# Patient Record
Sex: Female | Born: 1935 | Race: White | Hispanic: No | Smoking: Never smoker
Health system: Southern US, Community
[De-identification: ages and names within clinical notes are randomized; demographics above are authoritative.]

## PROBLEM LIST (undated history)

## (undated) DIAGNOSIS — E7800 Pure hypercholesterolemia, unspecified: Secondary | ICD-10-CM

## (undated) DIAGNOSIS — H5440 Blindness, one eye, unspecified eye: Secondary | ICD-10-CM

## (undated) DIAGNOSIS — M8580 Other specified disorders of bone density and structure, unspecified site: Secondary | ICD-10-CM

## (undated) DIAGNOSIS — E079 Disorder of thyroid, unspecified: Secondary | ICD-10-CM

## (undated) DIAGNOSIS — M1990 Unspecified osteoarthritis, unspecified site: Secondary | ICD-10-CM

## (undated) DIAGNOSIS — I10 Essential (primary) hypertension: Secondary | ICD-10-CM

## (undated) DIAGNOSIS — L900 Lichen sclerosus et atrophicus: Secondary | ICD-10-CM

## (undated) HISTORY — DX: Unspecified osteoarthritis, unspecified site: M19.90

## (undated) HISTORY — DX: Essential (primary) hypertension: I10

## (undated) HISTORY — PX: EYE SURGERY: SHX253

## (undated) HISTORY — DX: Disorder of thyroid, unspecified: E07.9

## (undated) HISTORY — DX: Other specified disorders of bone density and structure, unspecified site: M85.80

## (undated) HISTORY — DX: Pure hypercholesterolemia, unspecified: E78.00

## (undated) HISTORY — DX: Blindness, one eye, unspecified eye: H54.40

## (undated) HISTORY — DX: Lichen sclerosus et atrophicus: L90.0

---

## 2003-06-04 DIAGNOSIS — S0590XA Unspecified injury of unspecified eye and orbit, initial encounter: Secondary | ICD-10-CM

## 2003-06-04 HISTORY — DX: Unspecified injury of unspecified eye and orbit, initial encounter: S05.90XA

## 2015-08-07 DIAGNOSIS — N904 Leukoplakia of vulva: Secondary | ICD-10-CM

## 2015-08-07 HISTORY — DX: Leukoplakia of vulva: N90.4

## 2016-01-28 DIAGNOSIS — M1711 Unilateral primary osteoarthritis, right knee: Secondary | ICD-10-CM

## 2016-01-28 HISTORY — DX: Unilateral primary osteoarthritis, right knee: M17.11

## 2016-03-11 DIAGNOSIS — Z471 Aftercare following joint replacement surgery: Secondary | ICD-10-CM | POA: Insufficient documentation

## 2016-03-11 DIAGNOSIS — Z96651 Presence of right artificial knee joint: Secondary | ICD-10-CM

## 2016-03-11 HISTORY — DX: Aftercare following joint replacement surgery: Z47.1

## 2016-03-11 HISTORY — DX: Presence of right artificial knee joint: Z96.651

## 2016-07-04 DIAGNOSIS — M1611 Unilateral primary osteoarthritis, right hip: Secondary | ICD-10-CM

## 2016-07-04 HISTORY — DX: Unilateral primary osteoarthritis, right hip: M16.11

## 2016-08-15 DIAGNOSIS — L300 Nummular dermatitis: Secondary | ICD-10-CM | POA: Diagnosis not present

## 2016-08-15 DIAGNOSIS — L821 Other seborrheic keratosis: Secondary | ICD-10-CM | POA: Diagnosis not present

## 2016-08-15 DIAGNOSIS — L299 Pruritus, unspecified: Secondary | ICD-10-CM | POA: Diagnosis not present

## 2016-10-29 DIAGNOSIS — R600 Localized edema: Secondary | ICD-10-CM | POA: Diagnosis not present

## 2016-10-29 DIAGNOSIS — R21 Rash and other nonspecific skin eruption: Secondary | ICD-10-CM | POA: Diagnosis not present

## 2016-10-29 DIAGNOSIS — R32 Unspecified urinary incontinence: Secondary | ICD-10-CM | POA: Diagnosis not present

## 2016-10-29 DIAGNOSIS — Z6831 Body mass index (BMI) 31.0-31.9, adult: Secondary | ICD-10-CM | POA: Diagnosis not present

## 2016-10-31 DIAGNOSIS — R600 Localized edema: Secondary | ICD-10-CM | POA: Diagnosis not present

## 2016-10-31 DIAGNOSIS — M7989 Other specified soft tissue disorders: Secondary | ICD-10-CM | POA: Diagnosis not present

## 2016-10-31 DIAGNOSIS — M79661 Pain in right lower leg: Secondary | ICD-10-CM | POA: Diagnosis not present

## 2016-10-31 DIAGNOSIS — M79662 Pain in left lower leg: Secondary | ICD-10-CM | POA: Diagnosis not present

## 2016-11-01 DIAGNOSIS — L300 Nummular dermatitis: Secondary | ICD-10-CM | POA: Diagnosis not present

## 2016-11-01 DIAGNOSIS — L299 Pruritus, unspecified: Secondary | ICD-10-CM | POA: Diagnosis not present

## 2016-12-31 DIAGNOSIS — I10 Essential (primary) hypertension: Secondary | ICD-10-CM | POA: Diagnosis not present

## 2016-12-31 DIAGNOSIS — Z6831 Body mass index (BMI) 31.0-31.9, adult: Secondary | ICD-10-CM | POA: Diagnosis not present

## 2016-12-31 DIAGNOSIS — L309 Dermatitis, unspecified: Secondary | ICD-10-CM | POA: Diagnosis not present

## 2017-01-22 DIAGNOSIS — R21 Rash and other nonspecific skin eruption: Secondary | ICD-10-CM | POA: Diagnosis not present

## 2017-01-22 DIAGNOSIS — I10 Essential (primary) hypertension: Secondary | ICD-10-CM | POA: Diagnosis not present

## 2017-01-22 DIAGNOSIS — R946 Abnormal results of thyroid function studies: Secondary | ICD-10-CM | POA: Diagnosis not present

## 2017-02-04 DIAGNOSIS — Z85828 Personal history of other malignant neoplasm of skin: Secondary | ICD-10-CM | POA: Diagnosis not present

## 2017-02-04 DIAGNOSIS — D0371 Melanoma in situ of right lower limb, including hip: Secondary | ICD-10-CM | POA: Diagnosis not present

## 2017-02-04 DIAGNOSIS — L308 Other specified dermatitis: Secondary | ICD-10-CM | POA: Diagnosis not present

## 2017-02-04 DIAGNOSIS — D229 Melanocytic nevi, unspecified: Secondary | ICD-10-CM | POA: Diagnosis not present

## 2017-02-04 DIAGNOSIS — D485 Neoplasm of uncertain behavior of skin: Secondary | ICD-10-CM | POA: Diagnosis not present

## 2017-02-04 DIAGNOSIS — Z08 Encounter for follow-up examination after completed treatment for malignant neoplasm: Secondary | ICD-10-CM | POA: Diagnosis not present

## 2017-02-04 DIAGNOSIS — L309 Dermatitis, unspecified: Secondary | ICD-10-CM | POA: Diagnosis not present

## 2017-02-20 DIAGNOSIS — L905 Scar conditions and fibrosis of skin: Secondary | ICD-10-CM | POA: Diagnosis not present

## 2017-02-20 DIAGNOSIS — D0371 Melanoma in situ of right lower limb, including hip: Secondary | ICD-10-CM | POA: Diagnosis not present

## 2017-03-21 DIAGNOSIS — Z23 Encounter for immunization: Secondary | ICD-10-CM | POA: Diagnosis not present

## 2017-04-10 DIAGNOSIS — M9902 Segmental and somatic dysfunction of thoracic region: Secondary | ICD-10-CM | POA: Diagnosis not present

## 2017-04-10 DIAGNOSIS — M5137 Other intervertebral disc degeneration, lumbosacral region: Secondary | ICD-10-CM | POA: Diagnosis not present

## 2017-04-10 DIAGNOSIS — M9904 Segmental and somatic dysfunction of sacral region: Secondary | ICD-10-CM | POA: Diagnosis not present

## 2017-04-10 DIAGNOSIS — M791 Myalgia, unspecified site: Secondary | ICD-10-CM | POA: Diagnosis not present

## 2017-04-10 DIAGNOSIS — M9903 Segmental and somatic dysfunction of lumbar region: Secondary | ICD-10-CM | POA: Diagnosis not present

## 2017-04-22 DIAGNOSIS — S0990XA Unspecified injury of head, initial encounter: Secondary | ICD-10-CM | POA: Diagnosis not present

## 2017-04-22 DIAGNOSIS — Z1339 Encounter for screening examination for other mental health and behavioral disorders: Secondary | ICD-10-CM | POA: Diagnosis not present

## 2017-04-22 DIAGNOSIS — S51819A Laceration without foreign body of unspecified forearm, initial encounter: Secondary | ICD-10-CM | POA: Diagnosis not present

## 2017-04-29 DIAGNOSIS — Z6831 Body mass index (BMI) 31.0-31.9, adult: Secondary | ICD-10-CM | POA: Diagnosis not present

## 2017-04-29 DIAGNOSIS — R32 Unspecified urinary incontinence: Secondary | ICD-10-CM | POA: Diagnosis not present

## 2017-04-29 DIAGNOSIS — S51819A Laceration without foreign body of unspecified forearm, initial encounter: Secondary | ICD-10-CM | POA: Diagnosis not present

## 2017-05-01 DIAGNOSIS — M9904 Segmental and somatic dysfunction of sacral region: Secondary | ICD-10-CM | POA: Diagnosis not present

## 2017-05-01 DIAGNOSIS — M9902 Segmental and somatic dysfunction of thoracic region: Secondary | ICD-10-CM | POA: Diagnosis not present

## 2017-05-01 DIAGNOSIS — M791 Myalgia, unspecified site: Secondary | ICD-10-CM | POA: Diagnosis not present

## 2017-05-01 DIAGNOSIS — M9903 Segmental and somatic dysfunction of lumbar region: Secondary | ICD-10-CM | POA: Diagnosis not present

## 2017-05-01 DIAGNOSIS — M5137 Other intervertebral disc degeneration, lumbosacral region: Secondary | ICD-10-CM | POA: Diagnosis not present

## 2017-05-08 DIAGNOSIS — M5137 Other intervertebral disc degeneration, lumbosacral region: Secondary | ICD-10-CM | POA: Diagnosis not present

## 2017-05-08 DIAGNOSIS — M9902 Segmental and somatic dysfunction of thoracic region: Secondary | ICD-10-CM | POA: Diagnosis not present

## 2017-05-08 DIAGNOSIS — M9903 Segmental and somatic dysfunction of lumbar region: Secondary | ICD-10-CM | POA: Diagnosis not present

## 2017-05-08 DIAGNOSIS — M791 Myalgia, unspecified site: Secondary | ICD-10-CM | POA: Diagnosis not present

## 2017-05-08 DIAGNOSIS — M9904 Segmental and somatic dysfunction of sacral region: Secondary | ICD-10-CM | POA: Diagnosis not present

## 2017-05-20 DIAGNOSIS — N3001 Acute cystitis with hematuria: Secondary | ICD-10-CM | POA: Diagnosis not present

## 2017-05-20 DIAGNOSIS — I10 Essential (primary) hypertension: Secondary | ICD-10-CM | POA: Diagnosis not present

## 2017-05-20 DIAGNOSIS — Z6831 Body mass index (BMI) 31.0-31.9, adult: Secondary | ICD-10-CM | POA: Diagnosis not present

## 2017-05-20 DIAGNOSIS — S0990XA Unspecified injury of head, initial encounter: Secondary | ICD-10-CM | POA: Diagnosis not present

## 2017-06-05 DIAGNOSIS — M791 Myalgia, unspecified site: Secondary | ICD-10-CM | POA: Diagnosis not present

## 2017-06-05 DIAGNOSIS — M9904 Segmental and somatic dysfunction of sacral region: Secondary | ICD-10-CM | POA: Diagnosis not present

## 2017-06-05 DIAGNOSIS — M5137 Other intervertebral disc degeneration, lumbosacral region: Secondary | ICD-10-CM | POA: Diagnosis not present

## 2017-06-05 DIAGNOSIS — M9902 Segmental and somatic dysfunction of thoracic region: Secondary | ICD-10-CM | POA: Diagnosis not present

## 2017-06-05 DIAGNOSIS — M9903 Segmental and somatic dysfunction of lumbar region: Secondary | ICD-10-CM | POA: Diagnosis not present

## 2017-06-06 DIAGNOSIS — R05 Cough: Secondary | ICD-10-CM | POA: Diagnosis not present

## 2017-06-06 DIAGNOSIS — Z1331 Encounter for screening for depression: Secondary | ICD-10-CM | POA: Diagnosis not present

## 2017-06-06 DIAGNOSIS — Z6831 Body mass index (BMI) 31.0-31.9, adult: Secondary | ICD-10-CM | POA: Diagnosis not present

## 2017-06-06 DIAGNOSIS — I10 Essential (primary) hypertension: Secondary | ICD-10-CM | POA: Diagnosis not present

## 2017-06-06 DIAGNOSIS — Z9181 History of falling: Secondary | ICD-10-CM | POA: Diagnosis not present

## 2017-06-06 DIAGNOSIS — Z0000 Encounter for general adult medical examination without abnormal findings: Secondary | ICD-10-CM | POA: Diagnosis not present

## 2017-06-06 DIAGNOSIS — R600 Localized edema: Secondary | ICD-10-CM | POA: Diagnosis not present

## 2017-06-17 DIAGNOSIS — R946 Abnormal results of thyroid function studies: Secondary | ICD-10-CM | POA: Diagnosis not present

## 2017-06-26 DIAGNOSIS — M5137 Other intervertebral disc degeneration, lumbosacral region: Secondary | ICD-10-CM | POA: Diagnosis not present

## 2017-06-26 DIAGNOSIS — M791 Myalgia, unspecified site: Secondary | ICD-10-CM | POA: Diagnosis not present

## 2017-06-26 DIAGNOSIS — M9902 Segmental and somatic dysfunction of thoracic region: Secondary | ICD-10-CM | POA: Diagnosis not present

## 2017-06-26 DIAGNOSIS — M9904 Segmental and somatic dysfunction of sacral region: Secondary | ICD-10-CM | POA: Diagnosis not present

## 2017-06-26 DIAGNOSIS — M9903 Segmental and somatic dysfunction of lumbar region: Secondary | ICD-10-CM | POA: Diagnosis not present

## 2017-06-30 DIAGNOSIS — R490 Dysphonia: Secondary | ICD-10-CM | POA: Diagnosis not present

## 2017-06-30 DIAGNOSIS — R946 Abnormal results of thyroid function studies: Secondary | ICD-10-CM | POA: Diagnosis not present

## 2017-07-03 DIAGNOSIS — M791 Myalgia, unspecified site: Secondary | ICD-10-CM | POA: Diagnosis not present

## 2017-07-03 DIAGNOSIS — M9903 Segmental and somatic dysfunction of lumbar region: Secondary | ICD-10-CM | POA: Diagnosis not present

## 2017-07-03 DIAGNOSIS — M5137 Other intervertebral disc degeneration, lumbosacral region: Secondary | ICD-10-CM | POA: Diagnosis not present

## 2017-07-03 DIAGNOSIS — M9904 Segmental and somatic dysfunction of sacral region: Secondary | ICD-10-CM | POA: Diagnosis not present

## 2017-07-03 DIAGNOSIS — M9902 Segmental and somatic dysfunction of thoracic region: Secondary | ICD-10-CM | POA: Diagnosis not present

## 2017-07-10 DIAGNOSIS — M5137 Other intervertebral disc degeneration, lumbosacral region: Secondary | ICD-10-CM | POA: Diagnosis not present

## 2017-07-10 DIAGNOSIS — M791 Myalgia, unspecified site: Secondary | ICD-10-CM | POA: Diagnosis not present

## 2017-07-10 DIAGNOSIS — M9902 Segmental and somatic dysfunction of thoracic region: Secondary | ICD-10-CM | POA: Diagnosis not present

## 2017-07-10 DIAGNOSIS — M9904 Segmental and somatic dysfunction of sacral region: Secondary | ICD-10-CM | POA: Diagnosis not present

## 2017-07-10 DIAGNOSIS — M9903 Segmental and somatic dysfunction of lumbar region: Secondary | ICD-10-CM | POA: Diagnosis not present

## 2017-07-24 DIAGNOSIS — M9902 Segmental and somatic dysfunction of thoracic region: Secondary | ICD-10-CM | POA: Diagnosis not present

## 2017-07-24 DIAGNOSIS — M9903 Segmental and somatic dysfunction of lumbar region: Secondary | ICD-10-CM | POA: Diagnosis not present

## 2017-07-24 DIAGNOSIS — M791 Myalgia, unspecified site: Secondary | ICD-10-CM | POA: Diagnosis not present

## 2017-07-24 DIAGNOSIS — M5137 Other intervertebral disc degeneration, lumbosacral region: Secondary | ICD-10-CM | POA: Diagnosis not present

## 2017-07-24 DIAGNOSIS — M9904 Segmental and somatic dysfunction of sacral region: Secondary | ICD-10-CM | POA: Diagnosis not present

## 2017-08-05 DIAGNOSIS — H04123 Dry eye syndrome of bilateral lacrimal glands: Secondary | ICD-10-CM | POA: Diagnosis not present

## 2017-08-05 DIAGNOSIS — H524 Presbyopia: Secondary | ICD-10-CM | POA: Diagnosis not present

## 2017-08-07 DIAGNOSIS — M791 Myalgia, unspecified site: Secondary | ICD-10-CM | POA: Diagnosis not present

## 2017-08-07 DIAGNOSIS — M9904 Segmental and somatic dysfunction of sacral region: Secondary | ICD-10-CM | POA: Diagnosis not present

## 2017-08-07 DIAGNOSIS — M9903 Segmental and somatic dysfunction of lumbar region: Secondary | ICD-10-CM | POA: Diagnosis not present

## 2017-08-07 DIAGNOSIS — M9902 Segmental and somatic dysfunction of thoracic region: Secondary | ICD-10-CM | POA: Diagnosis not present

## 2017-08-07 DIAGNOSIS — M5137 Other intervertebral disc degeneration, lumbosacral region: Secondary | ICD-10-CM | POA: Diagnosis not present

## 2017-08-13 DIAGNOSIS — D229 Melanocytic nevi, unspecified: Secondary | ICD-10-CM | POA: Diagnosis not present

## 2017-08-13 DIAGNOSIS — Z85820 Personal history of malignant melanoma of skin: Secondary | ICD-10-CM | POA: Diagnosis not present

## 2017-08-13 DIAGNOSIS — B078 Other viral warts: Secondary | ICD-10-CM | POA: Diagnosis not present

## 2017-08-13 DIAGNOSIS — L821 Other seborrheic keratosis: Secondary | ICD-10-CM | POA: Diagnosis not present

## 2017-08-13 DIAGNOSIS — L309 Dermatitis, unspecified: Secondary | ICD-10-CM | POA: Diagnosis not present

## 2017-08-13 DIAGNOSIS — Z08 Encounter for follow-up examination after completed treatment for malignant neoplasm: Secondary | ICD-10-CM | POA: Diagnosis not present

## 2017-08-13 DIAGNOSIS — Z85828 Personal history of other malignant neoplasm of skin: Secondary | ICD-10-CM | POA: Diagnosis not present

## 2017-08-28 DIAGNOSIS — M791 Myalgia, unspecified site: Secondary | ICD-10-CM | POA: Diagnosis not present

## 2017-08-28 DIAGNOSIS — M9902 Segmental and somatic dysfunction of thoracic region: Secondary | ICD-10-CM | POA: Diagnosis not present

## 2017-08-28 DIAGNOSIS — M5137 Other intervertebral disc degeneration, lumbosacral region: Secondary | ICD-10-CM | POA: Diagnosis not present

## 2017-08-28 DIAGNOSIS — M9904 Segmental and somatic dysfunction of sacral region: Secondary | ICD-10-CM | POA: Diagnosis not present

## 2017-08-28 DIAGNOSIS — M9903 Segmental and somatic dysfunction of lumbar region: Secondary | ICD-10-CM | POA: Diagnosis not present

## 2017-09-05 DIAGNOSIS — N9089 Other specified noninflammatory disorders of vulva and perineum: Secondary | ICD-10-CM | POA: Diagnosis not present

## 2017-09-05 DIAGNOSIS — L259 Unspecified contact dermatitis, unspecified cause: Secondary | ICD-10-CM | POA: Diagnosis not present

## 2017-09-11 DIAGNOSIS — M791 Myalgia, unspecified site: Secondary | ICD-10-CM | POA: Diagnosis not present

## 2017-09-11 DIAGNOSIS — M5137 Other intervertebral disc degeneration, lumbosacral region: Secondary | ICD-10-CM | POA: Diagnosis not present

## 2017-09-11 DIAGNOSIS — M9902 Segmental and somatic dysfunction of thoracic region: Secondary | ICD-10-CM | POA: Diagnosis not present

## 2017-09-11 DIAGNOSIS — M9904 Segmental and somatic dysfunction of sacral region: Secondary | ICD-10-CM | POA: Diagnosis not present

## 2017-09-11 DIAGNOSIS — M9903 Segmental and somatic dysfunction of lumbar region: Secondary | ICD-10-CM | POA: Diagnosis not present

## 2017-09-17 DIAGNOSIS — N763 Subacute and chronic vulvitis: Secondary | ICD-10-CM | POA: Diagnosis not present

## 2017-09-30 DIAGNOSIS — E0590 Thyrotoxicosis, unspecified without thyrotoxic crisis or storm: Secondary | ICD-10-CM | POA: Diagnosis not present

## 2017-10-09 DIAGNOSIS — M5137 Other intervertebral disc degeneration, lumbosacral region: Secondary | ICD-10-CM | POA: Diagnosis not present

## 2017-10-09 DIAGNOSIS — M9903 Segmental and somatic dysfunction of lumbar region: Secondary | ICD-10-CM | POA: Diagnosis not present

## 2017-10-09 DIAGNOSIS — M9904 Segmental and somatic dysfunction of sacral region: Secondary | ICD-10-CM | POA: Diagnosis not present

## 2017-10-09 DIAGNOSIS — M9902 Segmental and somatic dysfunction of thoracic region: Secondary | ICD-10-CM | POA: Diagnosis not present

## 2017-10-09 DIAGNOSIS — M791 Myalgia, unspecified site: Secondary | ICD-10-CM | POA: Diagnosis not present

## 2017-10-28 DIAGNOSIS — M9903 Segmental and somatic dysfunction of lumbar region: Secondary | ICD-10-CM | POA: Diagnosis not present

## 2017-10-28 DIAGNOSIS — M791 Myalgia, unspecified site: Secondary | ICD-10-CM | POA: Diagnosis not present

## 2017-10-28 DIAGNOSIS — M9904 Segmental and somatic dysfunction of sacral region: Secondary | ICD-10-CM | POA: Diagnosis not present

## 2017-10-28 DIAGNOSIS — M9902 Segmental and somatic dysfunction of thoracic region: Secondary | ICD-10-CM | POA: Diagnosis not present

## 2017-10-28 DIAGNOSIS — M5137 Other intervertebral disc degeneration, lumbosacral region: Secondary | ICD-10-CM | POA: Diagnosis not present

## 2017-11-06 DIAGNOSIS — E042 Nontoxic multinodular goiter: Secondary | ICD-10-CM | POA: Diagnosis not present

## 2017-11-06 DIAGNOSIS — R946 Abnormal results of thyroid function studies: Secondary | ICD-10-CM | POA: Diagnosis not present

## 2017-11-07 DIAGNOSIS — E042 Nontoxic multinodular goiter: Secondary | ICD-10-CM | POA: Diagnosis not present

## 2017-11-07 DIAGNOSIS — E0590 Thyrotoxicosis, unspecified without thyrotoxic crisis or storm: Secondary | ICD-10-CM | POA: Diagnosis not present

## 2017-11-07 DIAGNOSIS — R946 Abnormal results of thyroid function studies: Secondary | ICD-10-CM | POA: Diagnosis not present

## 2017-12-05 DIAGNOSIS — R2230 Localized swelling, mass and lump, unspecified upper limb: Secondary | ICD-10-CM | POA: Diagnosis not present

## 2017-12-05 DIAGNOSIS — E785 Hyperlipidemia, unspecified: Secondary | ICD-10-CM | POA: Diagnosis not present

## 2017-12-05 DIAGNOSIS — R5383 Other fatigue: Secondary | ICD-10-CM | POA: Diagnosis not present

## 2017-12-05 DIAGNOSIS — Z6829 Body mass index (BMI) 29.0-29.9, adult: Secondary | ICD-10-CM | POA: Diagnosis not present

## 2017-12-05 DIAGNOSIS — I10 Essential (primary) hypertension: Secondary | ICD-10-CM | POA: Diagnosis not present

## 2017-12-05 DIAGNOSIS — Z79899 Other long term (current) drug therapy: Secondary | ICD-10-CM | POA: Diagnosis not present

## 2018-01-20 DIAGNOSIS — E0590 Thyrotoxicosis, unspecified without thyrotoxic crisis or storm: Secondary | ICD-10-CM | POA: Diagnosis not present

## 2018-02-16 ENCOUNTER — Other Ambulatory Visit: Payer: Self-pay | Admitting: Family Medicine

## 2018-02-16 DIAGNOSIS — Z1231 Encounter for screening mammogram for malignant neoplasm of breast: Secondary | ICD-10-CM

## 2018-02-24 DIAGNOSIS — Z08 Encounter for follow-up examination after completed treatment for malignant neoplasm: Secondary | ICD-10-CM | POA: Diagnosis not present

## 2018-02-24 DIAGNOSIS — L853 Xerosis cutis: Secondary | ICD-10-CM | POA: Diagnosis not present

## 2018-02-24 DIAGNOSIS — D229 Melanocytic nevi, unspecified: Secondary | ICD-10-CM | POA: Diagnosis not present

## 2018-02-24 DIAGNOSIS — Z85820 Personal history of malignant melanoma of skin: Secondary | ICD-10-CM | POA: Diagnosis not present

## 2018-02-24 DIAGNOSIS — Z85828 Personal history of other malignant neoplasm of skin: Secondary | ICD-10-CM | POA: Diagnosis not present

## 2018-02-24 DIAGNOSIS — L309 Dermatitis, unspecified: Secondary | ICD-10-CM | POA: Diagnosis not present

## 2018-02-24 DIAGNOSIS — B078 Other viral warts: Secondary | ICD-10-CM | POA: Diagnosis not present

## 2018-03-06 DIAGNOSIS — Z23 Encounter for immunization: Secondary | ICD-10-CM | POA: Diagnosis not present

## 2018-03-10 DIAGNOSIS — L309 Dermatitis, unspecified: Secondary | ICD-10-CM | POA: Diagnosis not present

## 2018-03-18 ENCOUNTER — Ambulatory Visit
Admission: RE | Admit: 2018-03-18 | Discharge: 2018-03-18 | Disposition: A | Discharge status: Home / Self Care | Payer: Medicare Other | Source: Ambulatory Visit | Attending: Family Medicine | Admitting: Family Medicine

## 2018-03-18 ENCOUNTER — Encounter: Payer: Self-pay | Admitting: Radiology

## 2018-03-18 DIAGNOSIS — Z1231 Encounter for screening mammogram for malignant neoplasm of breast: Secondary | ICD-10-CM

## 2018-03-19 DIAGNOSIS — E7800 Pure hypercholesterolemia, unspecified: Secondary | ICD-10-CM | POA: Diagnosis not present

## 2018-03-19 DIAGNOSIS — Z13820 Encounter for screening for osteoporosis: Secondary | ICD-10-CM | POA: Diagnosis not present

## 2018-03-19 DIAGNOSIS — E0590 Thyrotoxicosis, unspecified without thyrotoxic crisis or storm: Secondary | ICD-10-CM | POA: Diagnosis not present

## 2018-03-19 DIAGNOSIS — M859 Disorder of bone density and structure, unspecified: Secondary | ICD-10-CM | POA: Diagnosis not present

## 2018-03-19 DIAGNOSIS — Z6828 Body mass index (BMI) 28.0-28.9, adult: Secondary | ICD-10-CM | POA: Diagnosis not present

## 2018-03-19 DIAGNOSIS — M8580 Other specified disorders of bone density and structure, unspecified site: Secondary | ICD-10-CM | POA: Diagnosis not present

## 2018-03-19 DIAGNOSIS — R899 Unspecified abnormal finding in specimens from other organs, systems and tissues: Secondary | ICD-10-CM | POA: Diagnosis not present

## 2018-03-19 DIAGNOSIS — I10 Essential (primary) hypertension: Secondary | ICD-10-CM | POA: Diagnosis not present

## 2018-03-19 DIAGNOSIS — M1990 Unspecified osteoarthritis, unspecified site: Secondary | ICD-10-CM | POA: Diagnosis not present

## 2018-07-15 DIAGNOSIS — L820 Inflamed seborrheic keratosis: Secondary | ICD-10-CM | POA: Diagnosis not present

## 2018-07-15 DIAGNOSIS — L821 Other seborrheic keratosis: Secondary | ICD-10-CM | POA: Diagnosis not present

## 2018-07-15 DIAGNOSIS — B079 Viral wart, unspecified: Secondary | ICD-10-CM | POA: Diagnosis not present

## 2018-08-10 DIAGNOSIS — H04123 Dry eye syndrome of bilateral lacrimal glands: Secondary | ICD-10-CM | POA: Diagnosis not present

## 2018-08-10 DIAGNOSIS — H179 Unspecified corneal scar and opacity: Secondary | ICD-10-CM | POA: Diagnosis not present

## 2018-08-10 DIAGNOSIS — H524 Presbyopia: Secondary | ICD-10-CM | POA: Diagnosis not present

## 2018-09-09 DIAGNOSIS — Z6828 Body mass index (BMI) 28.0-28.9, adult: Secondary | ICD-10-CM | POA: Diagnosis not present

## 2018-09-09 DIAGNOSIS — N3941 Urge incontinence: Secondary | ICD-10-CM | POA: Diagnosis not present

## 2018-09-09 DIAGNOSIS — E785 Hyperlipidemia, unspecified: Secondary | ICD-10-CM | POA: Diagnosis not present

## 2018-09-09 DIAGNOSIS — I10 Essential (primary) hypertension: Secondary | ICD-10-CM | POA: Diagnosis not present

## 2018-09-09 DIAGNOSIS — R5383 Other fatigue: Secondary | ICD-10-CM | POA: Diagnosis not present

## 2018-09-09 DIAGNOSIS — Z79899 Other long term (current) drug therapy: Secondary | ICD-10-CM | POA: Diagnosis not present

## 2018-09-09 DIAGNOSIS — Z0000 Encounter for general adult medical examination without abnormal findings: Secondary | ICD-10-CM | POA: Diagnosis not present

## 2018-09-09 DIAGNOSIS — L309 Dermatitis, unspecified: Secondary | ICD-10-CM | POA: Diagnosis not present

## 2019-02-18 DIAGNOSIS — L578 Other skin changes due to chronic exposure to nonionizing radiation: Secondary | ICD-10-CM | POA: Diagnosis not present

## 2019-02-18 DIAGNOSIS — L821 Other seborrheic keratosis: Secondary | ICD-10-CM | POA: Diagnosis not present

## 2019-02-18 DIAGNOSIS — D485 Neoplasm of uncertain behavior of skin: Secondary | ICD-10-CM | POA: Diagnosis not present

## 2019-02-18 DIAGNOSIS — L300 Nummular dermatitis: Secondary | ICD-10-CM | POA: Diagnosis not present

## 2019-02-18 DIAGNOSIS — L299 Pruritus, unspecified: Secondary | ICD-10-CM | POA: Diagnosis not present

## 2019-03-15 DIAGNOSIS — L300 Nummular dermatitis: Secondary | ICD-10-CM | POA: Diagnosis not present

## 2019-03-15 DIAGNOSIS — Z85820 Personal history of malignant melanoma of skin: Secondary | ICD-10-CM | POA: Diagnosis not present

## 2019-03-15 DIAGNOSIS — D2271 Melanocytic nevi of right lower limb, including hip: Secondary | ICD-10-CM | POA: Diagnosis not present

## 2019-03-23 DIAGNOSIS — Z23 Encounter for immunization: Secondary | ICD-10-CM | POA: Diagnosis not present

## 2019-04-19 DIAGNOSIS — Z6828 Body mass index (BMI) 28.0-28.9, adult: Secondary | ICD-10-CM | POA: Diagnosis not present

## 2019-04-19 DIAGNOSIS — Z1331 Encounter for screening for depression: Secondary | ICD-10-CM | POA: Diagnosis not present

## 2019-04-19 DIAGNOSIS — I10 Essential (primary) hypertension: Secondary | ICD-10-CM | POA: Diagnosis not present

## 2019-04-19 DIAGNOSIS — Z9181 History of falling: Secondary | ICD-10-CM | POA: Diagnosis not present

## 2019-04-19 DIAGNOSIS — E7800 Pure hypercholesterolemia, unspecified: Secondary | ICD-10-CM | POA: Diagnosis not present

## 2019-04-19 DIAGNOSIS — Z20828 Contact with and (suspected) exposure to other viral communicable diseases: Secondary | ICD-10-CM | POA: Diagnosis not present

## 2019-04-22 DIAGNOSIS — Z024 Encounter for examination for driving license: Secondary | ICD-10-CM | POA: Diagnosis not present

## 2019-05-14 DIAGNOSIS — L300 Nummular dermatitis: Secondary | ICD-10-CM | POA: Diagnosis not present

## 2019-05-14 DIAGNOSIS — L853 Xerosis cutis: Secondary | ICD-10-CM | POA: Diagnosis not present

## 2019-08-24 DIAGNOSIS — B351 Tinea unguium: Secondary | ICD-10-CM

## 2019-08-24 HISTORY — DX: Tinea unguium: B35.1

## 2019-11-09 IMAGING — MG DIGITAL SCREENING BILATERAL MAMMOGRAM WITH CAD
4 series · 4 of 4 positions shown · non-contrast
Comparison: Previous exam(s).

CLINICAL DATA: Screening.

EXAM:
DIGITAL SCREENING BILATERAL MAMMOGRAM WITH CAD

[R CC]
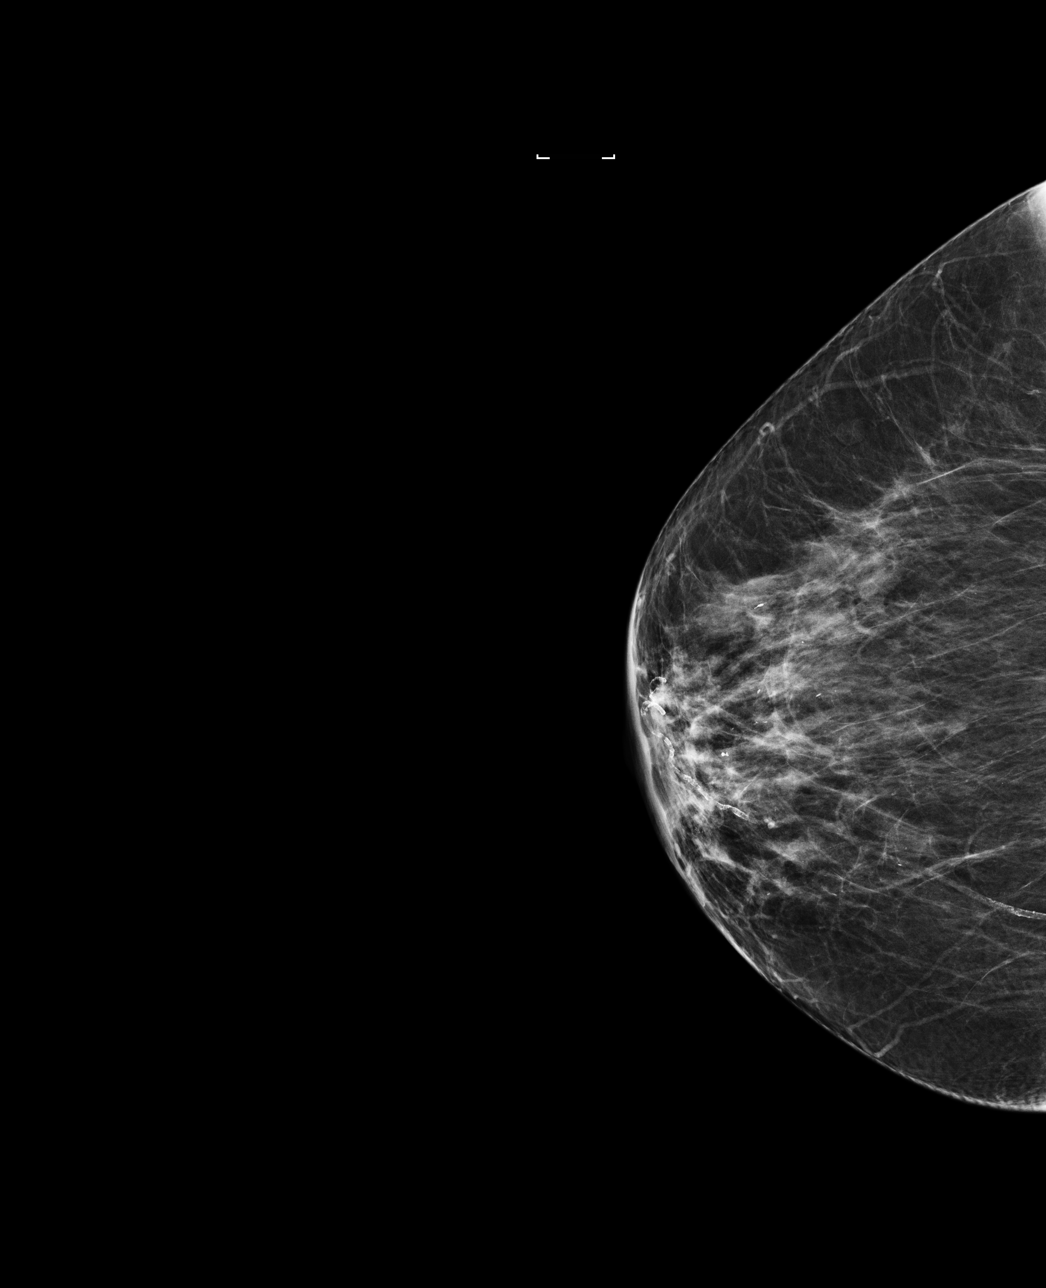

[L CC]
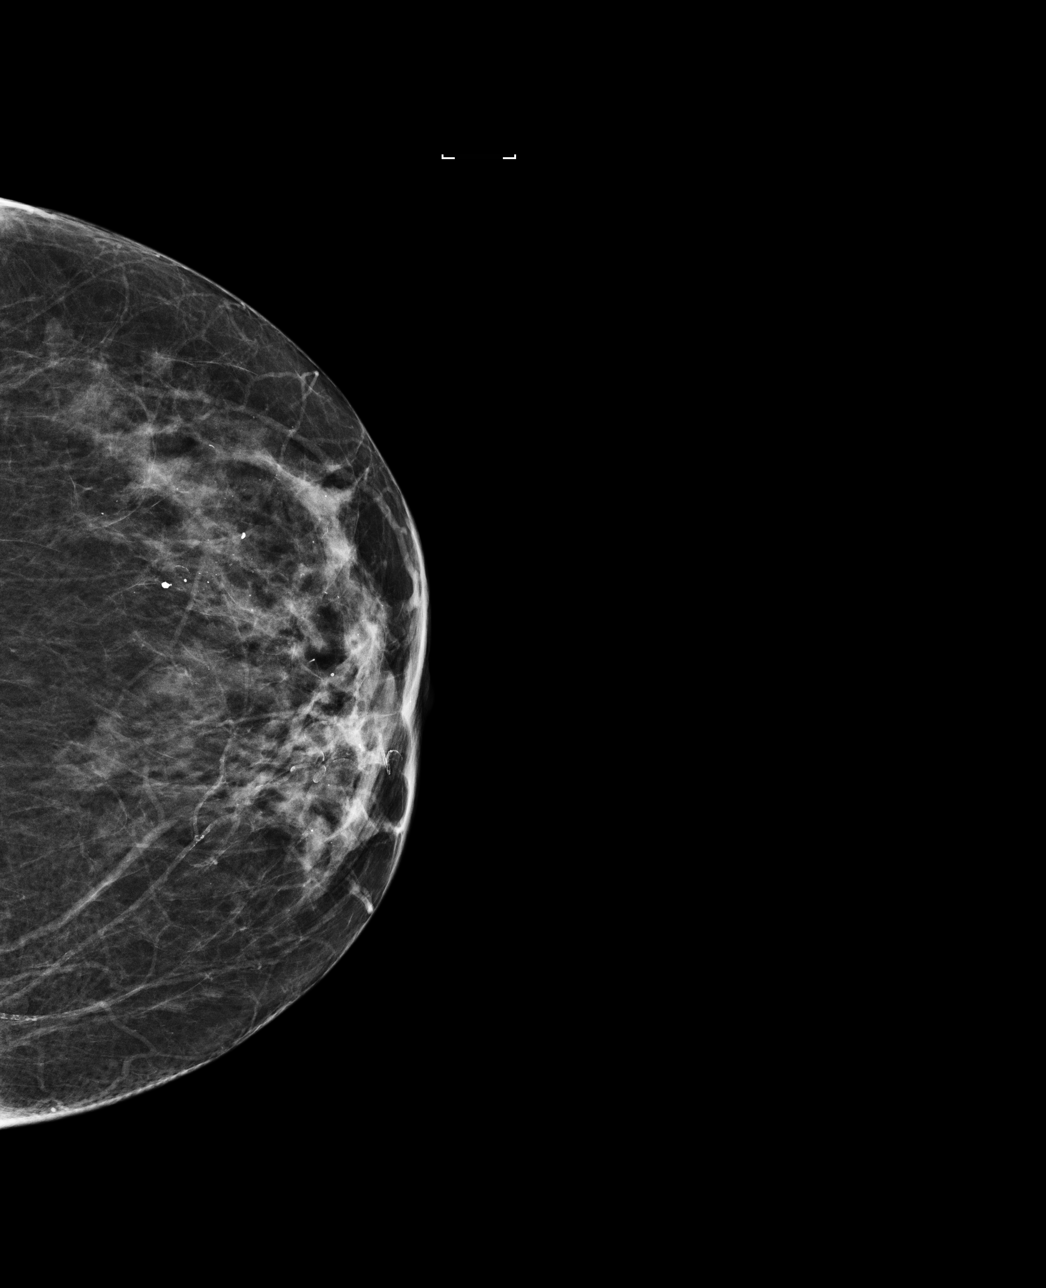

[R MLO]
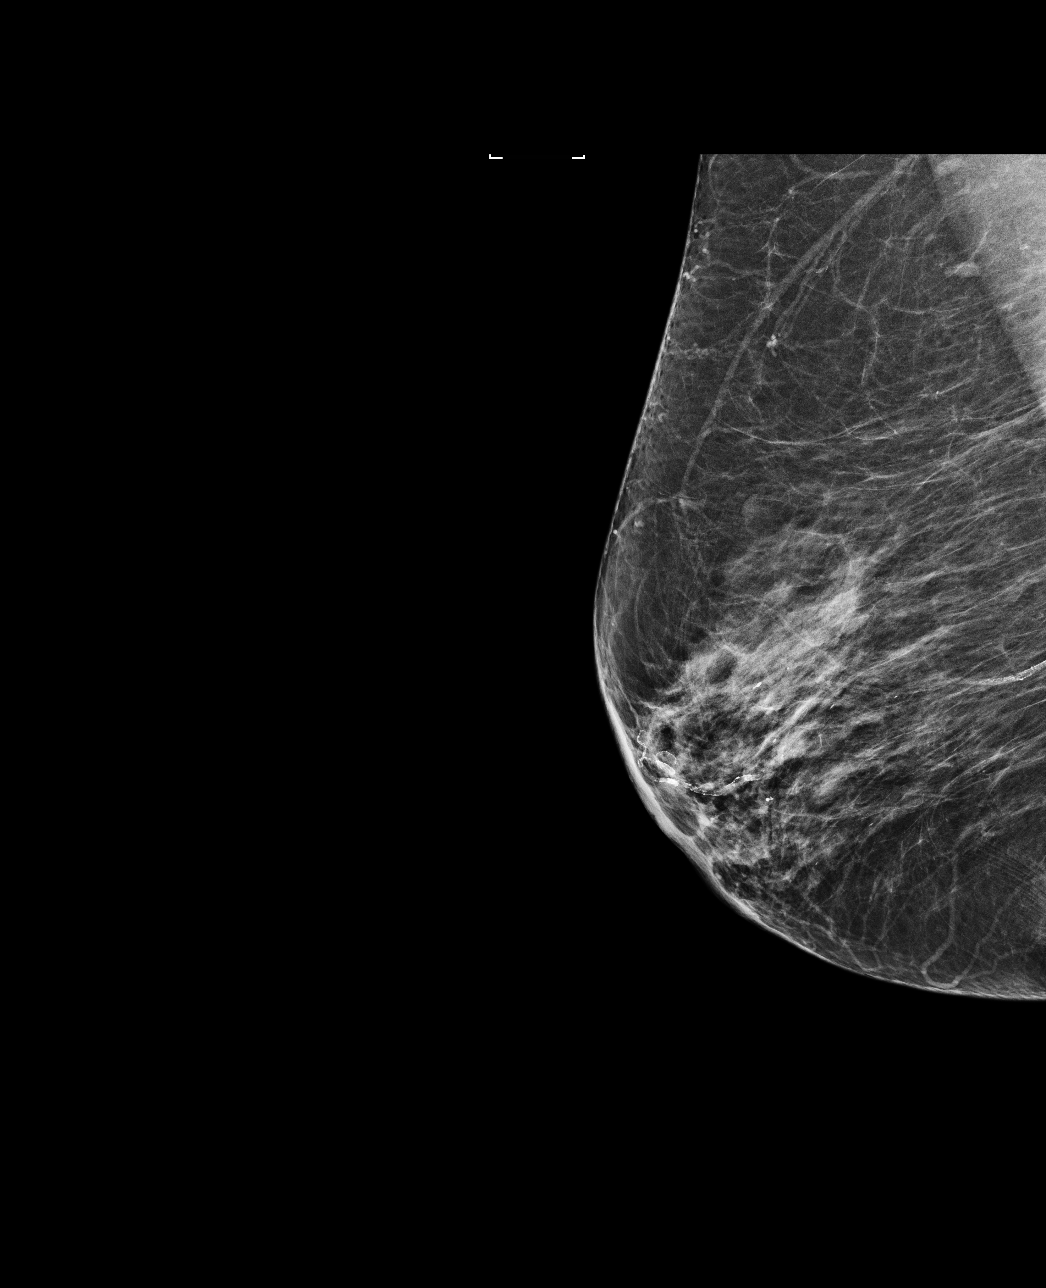

[L MLO]
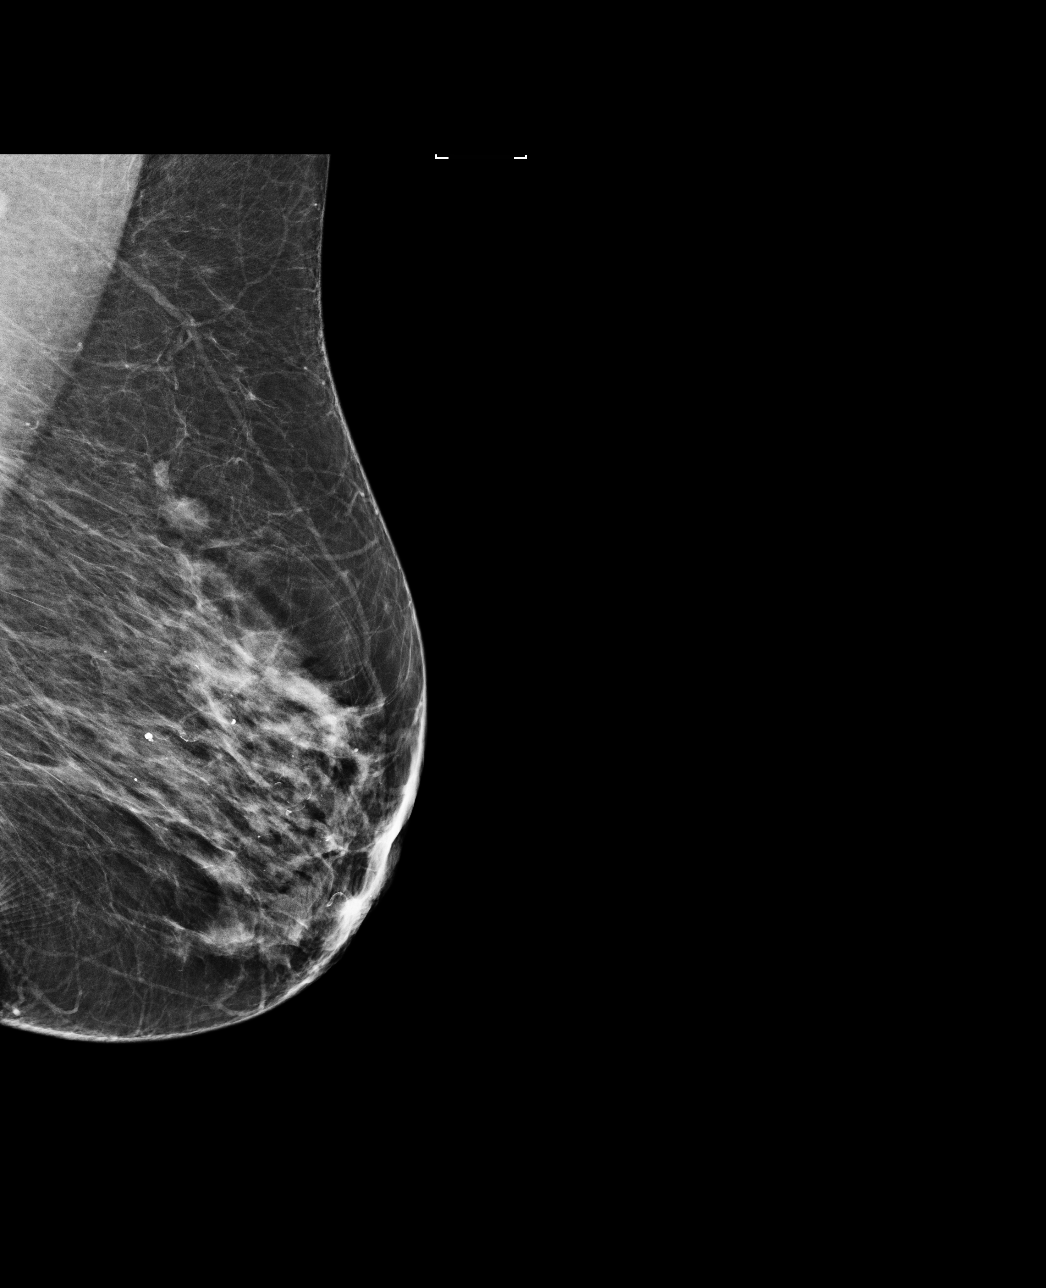

[4 of 4 positions shown; findings below may reference images not displayed]

ACR Breast Density Category c: The breast tissue is heterogeneously
dense, which may obscure small masses.
FINDINGS: There are no findings suspicious for malignancy. Images were
processed with CAD.
IMPRESSION: No mammographic evidence of malignancy. A result letter of this
screening mammogram will be mailed directly to the patient.

RECOMMENDATION:
Screening mammogram in one year. (Code:YJ-[id number])

BI-RADS CATEGORY  1: Negative.

## 2019-11-30 DIAGNOSIS — L97511 Non-pressure chronic ulcer of other part of right foot limited to breakdown of skin: Secondary | ICD-10-CM

## 2019-11-30 HISTORY — DX: Non-pressure chronic ulcer of other part of right foot limited to breakdown of skin: L97.511

## 2019-12-07 DIAGNOSIS — M48061 Spinal stenosis, lumbar region without neurogenic claudication: Secondary | ICD-10-CM

## 2019-12-07 HISTORY — DX: Spinal stenosis, lumbar region without neurogenic claudication: M48.061

## 2019-12-13 ENCOUNTER — Ambulatory Visit: Payer: Medicare Other | Admitting: Podiatry

## 2020-03-01 DIAGNOSIS — S8000XA Contusion of unspecified knee, initial encounter: Secondary | ICD-10-CM

## 2020-03-01 HISTORY — DX: Contusion of unspecified knee, initial encounter: S80.00XA

## 2020-04-03 DIAGNOSIS — R32 Unspecified urinary incontinence: Secondary | ICD-10-CM | POA: Insufficient documentation

## 2020-04-03 DIAGNOSIS — M1990 Unspecified osteoarthritis, unspecified site: Secondary | ICD-10-CM | POA: Insufficient documentation

## 2020-04-03 DIAGNOSIS — H5440 Blindness, one eye, unspecified eye: Secondary | ICD-10-CM | POA: Insufficient documentation

## 2020-04-03 DIAGNOSIS — T8859XA Other complications of anesthesia, initial encounter: Secondary | ICD-10-CM | POA: Insufficient documentation

## 2020-04-03 DIAGNOSIS — L900 Lichen sclerosus et atrophicus: Secondary | ICD-10-CM | POA: Insufficient documentation

## 2020-04-03 DIAGNOSIS — E079 Disorder of thyroid, unspecified: Secondary | ICD-10-CM | POA: Insufficient documentation

## 2020-04-03 DIAGNOSIS — E7800 Pure hypercholesterolemia, unspecified: Secondary | ICD-10-CM | POA: Insufficient documentation

## 2020-04-03 DIAGNOSIS — M8580 Other specified disorders of bone density and structure, unspecified site: Secondary | ICD-10-CM | POA: Insufficient documentation

## 2020-04-03 DIAGNOSIS — I10 Essential (primary) hypertension: Secondary | ICD-10-CM | POA: Insufficient documentation

## 2020-04-03 HISTORY — DX: Unspecified urinary incontinence: R32

## 2020-04-03 HISTORY — DX: Other complications of anesthesia, initial encounter: T88.59XA

## 2020-04-03 HISTORY — DX: Blindness, one eye, unspecified eye: H54.40

## 2020-04-04 NOTE — Progress Notes (Signed)
Cardiology Office Note:    Date:  04/05/2020   ID:  Regina Clayton, DOB 1932-12-25, MRN [id number]  PCP:  Angelina Sheriff, MD  Cardiologist:  Shirlee More, MD   Referring MD: Angelina Sheriff, MD  ASSESSMENT:    1. Chronic venous insufficiency   2. Essential hypertension   3. Need for immunization against influenza    PLAN:    In order of problems listed above:  1. I reviewed with Oza that clinically she has chronic venous insufficiency and has responded to conservative measures and her diuretic.  I told her the differential diagnosis included venous thromboembolism however she has had a normal duplex recently, heart failure cirrhosis and nephrotic syndrome.  Her proBNP level is quite low and she has no symptoms of heart failure no murmur on exam and a normal EKG I offered an echocardiogram but I told her I was confident it would be normal and she does not want to do the test.  There is nothing to suggest she has chronic liver disease or nephrotic syndrome.  She will continue her conservative measures her current diuretic has been very helpful.  Also asked her to restrict dietary sodium and elevate her legs during the day 2. BP at target continue current treatment she is on a combination ARB thiazide and loop diuretic 3. Hyperlipidemia stable continue statin 4. At her request given a flu shot  Next appointment as needed   Medication Adjustments/Labs and Tests Ordered: Current medicines are reviewed at length with the patient today.  Concerns regarding medicines are outlined above.  Orders Placed This Encounter  Procedures   Flu Vaccine QUAD High Dose(Fluad)   EKG 12-Lead   No orders of the defined types were placed in this encounter.    Complaint, lower extremity edema  History of Present Illness:    Regina Clayton is a 86 y.o. female with a history of hypertension hyperlipidemia and thyroid disease who is being seen today for the evaluation of swelling at the request of  Jacqlyn Larsen II, MD.  She has a history of bilateral total knee replacement and previous subclinical hyperthyroidism and thyroid nodule.  In the past amlodipine caused severe lower extremity edema.  She had a recent lower extremity venous duplex at [facility] 07/29/2019 for edema which showed no evidence of deep vein thrombosis.  She also had an ABI performed which was normal lower extremities  Recent labs from primary care physician show a creatinine of 1.1 GFR 56 cc/min potassium 4.4 she had a BNP level which was very low at 29  She has a history of dependent edema for years and she relates the onset after she had bilateral total knee replacement and was worse in the past when she took a calcium channel blocker.  She has tried support hose but they are uncomfortable recently her edema worsened was placed on loop diuretic and is doing much better.  She says that in the morning she has little or no edema and a moderate amount at the end of the day it is not painful or severe and in general she is pleased with the quality of her life.  She has not add salt to her diet she elevates her legs and has no background history of venous thromboembolism.  She has no symptoms to suggest heart failure no shortness of breath or orthopnea no history of known heart disease.  She has no history of liver disease or kidney disease.  I reviewed  her medications and none of them favor sodium retention. Past Medical History:  Diagnosis Date   Aftercare following right knee joint replacement surgery 03/11/2016   Anesthesia complication 48/10/4625   Formatting of this note might be different from the original. Slow to arouse after eye surgery [place] left eye 04/03/2020   Blindness of left eye    Contusion of knee, initial encounter 03/01/2020   Eye injury 2005   Left eye   Hypercholesteremia    Hypertension    Incontinence of urine in female 08/06/91    Lichen sclerosus    Lichen sclerosus of female genitalia 08/07/2015   Osteopenia    Primary osteoarthritis of right hip 07/04/2016   Primary osteoarthritis of right knee 01/28/2016   Spinal stenosis of lumbar region 12/07/2019   Thyroid disease    Toenail fungus 08/24/2019   Ulcer of toe, right, limited to breakdown of skin ([place]) 11/30/2019    Past Surgical History:  Procedure Laterality Date   EYE SURGERY      Current Medications: Current Meds  Medication Sig   alendronate (FOSAMAX) 70 MG tablet Take 70 mg by mouth once a week.   aspirin 81 MG chewable tablet Chew 81 mg by mouth daily.   atorvastatin (LIPITOR) 20 MG tablet Take 20 mg by mouth daily.   calcium carbonate (CALCIUM 600) 600 MG TABS tablet Take 600 mg by mouth daily.   Cholecalciferol (VITAMIN D) 50 MCG (2000 UT) CAPS Take 1 capsule by mouth daily.   furosemide (LASIX) 40 MG tablet Take 1 tablet by mouth every morning.   Multiple Vitamins-Minerals (CENTRUM SILVER 50+WOMEN PO) Take 1 tablet by mouth daily.   olmesartan-hydrochlorothiazide (BENICAR HCT) 40-12.5 MG tablet Take 1 tablet by mouth every morning.   Omega-3 1000 MG CAPS Take 1 capsule by mouth daily.   oxybutynin (DITROPAN) 5 MG tablet Take 5 mg by mouth 2 (two) times daily.   Probiotic Product (PROBIOTIC PO) Take 1 tablet by mouth daily.   [DISCONTINUED] amLODipine (NORVASC) 5 MG tablet Take 1 tablet by mouth daily.   [DISCONTINUED] clobetasol cream (TEMOVATE) 0.05 % in the morning and at bedtime.   [DISCONTINUED] meloxicam (MOBIC) 15 MG tablet Take 1 tablet by mouth daily.   [DISCONTINUED] metoprolol succinate (TOPROL-XL) 25 MG 24 hr tablet Take 1 tablet by mouth daily.     Allergies:   Patient has no known allergies.   Social History   Socioeconomic History   Marital status: Widowed    Spouse name: Not on file   Number of children: Not on file   Years of education: Not on file   Highest education level: Not on file    Occupational History   Not on file  Tobacco Use   Smoking status: Never Smoker   Smokeless tobacco: Never Used  Substance and Sexual Activity   Alcohol use: Never   Drug use: Never   Sexual activity: Not on file  Other Topics Concern   Not on file  Social History Narrative   Not on file   Social Determinants of Health   Financial Resource Strain:    Difficulty of Paying Living Expenses: Not on file  Food Insecurity:    Worried About [place] in the Last Year: Not on file   Ran Out of Food in the Last Year: Not on file  Transportation Needs:    Lack of Transportation (Medical): Not on file   Lack of  Transportation (Non-Medical): Not on file  Physical Activity:    Days of Exercise per Week: Not on file   Minutes of Exercise per Session: Not on file  Stress:    Feeling of Stress : Not on file  Social Connections:    Frequency of Communication with Friends and Family: Not on file   Frequency of Social Gatherings with Friends and Family: Not on file   Attends Religious Services: Not on file   Active Member of Clubs or Organizations: Not on file   Attends Archivist Meetings: Not on file   Marital Status: Not on file     Family History: The patient's family history includes Cancer in her daughter; Diabetes in her maternal grandmother; Heart disease in her father; Lymphoma in her son; Multiple sclerosis in her sister; Ovarian cancer in her mother.  ROS:   ROS Please see the history of present illness.     All other systems reviewed and are negative.  EKGs/Labs/Other Studies Reviewed:    The following studies were reviewed today:   EKG:  EKG is  ordered today.  The ekg ordered today is personally reviewed and demonstrates sinus rhythm 1 APC otherwise normal  Recent Labs: No results found for requested labs within last 8760 hours.  Recent Lipid Panel No results found for: CHOL, TRIG, HDL, CHOLHDL, VLDL, LDLCALC,  LDLDIRECT  Physical Exam:    VS:  BP 100/65    Pulse 80    Ht 5\' 5"  (1.651 m)    Wt 191 lb 12.8 oz (87 kg)    SpO2 96%    BMI 31.92 kg/m     Wt Readings from Last 3 Encounters:  04/05/20 191 lb 12.8 oz (87 kg)     GEN: She looks frail well nourished, well developed in no acute distress HEENT: Normal NECK: No JVD; No carotid bruits LYMPHATICS: No lymphadenopathy CARDIAC: RRR, no murmurs, rubs, gallops RESPIRATORY:  Clear to auscultation without rales, wheezing or rhonchi  ABDOMEN: Soft, non-tender, non-distended MUSCULOSKELETAL: She has stasis changes and 2+ boggy edema both lower extremities ; No deformity  SKIN: Warm and dry NEUROLOGIC:  Alert and oriented x 3 PSYCHIATRIC:  Normal affect     Signed, Shirlee More, MD  04/05/2020 4:28 PM    [facility]

## 2020-04-05 ENCOUNTER — Other Ambulatory Visit: Payer: Self-pay

## 2020-04-05 ENCOUNTER — Ambulatory Visit (INDEPENDENT_AMBULATORY_CARE_PROVIDER_SITE_OTHER): Payer: Medicare HMO | Admitting: Cardiology

## 2020-04-05 ENCOUNTER — Encounter: Payer: Self-pay | Admitting: Cardiology

## 2020-04-05 VITALS — BP 100/65 | HR 80 | Ht 65.0 in | Wt 191.8 lb

## 2020-04-05 DIAGNOSIS — I10 Essential (primary) hypertension: Secondary | ICD-10-CM | POA: Diagnosis not present

## 2020-04-05 DIAGNOSIS — I872 Venous insufficiency (chronic) (peripheral): Secondary | ICD-10-CM | POA: Diagnosis not present

## 2020-04-05 DIAGNOSIS — Z23 Encounter for immunization: Secondary | ICD-10-CM

## 2020-04-05 NOTE — Patient Instructions (Signed)

## 2020-08-04 DIAGNOSIS — I361 Nonrheumatic tricuspid (valve) insufficiency: Secondary | ICD-10-CM

## 2020-09-05 DIAGNOSIS — S9030XA Contusion of unspecified foot, initial encounter: Secondary | ICD-10-CM

## 2020-09-05 DIAGNOSIS — R296 Repeated falls: Secondary | ICD-10-CM

## 2020-09-05 DIAGNOSIS — M79672 Pain in left foot: Secondary | ICD-10-CM

## 2024-06-28 ENCOUNTER — Ambulatory Visit (HOSPITAL_BASED_OUTPATIENT_CLINIC_OR_DEPARTMENT_OTHER): Admitting: Family Medicine

## 2024-07-05 ENCOUNTER — Ambulatory Visit (HOSPITAL_BASED_OUTPATIENT_CLINIC_OR_DEPARTMENT_OTHER): Admitting: Family Medicine

## 2024-07-12 ENCOUNTER — Ambulatory Visit (HOSPITAL_BASED_OUTPATIENT_CLINIC_OR_DEPARTMENT_OTHER): Admitting: Family Medicine
